# Patient Record
Sex: Female | Born: 2000 | Race: Asian | Hispanic: No | Marital: Single | Smoking: Never smoker
Health system: Southern US, Community
[De-identification: ages and names within clinical notes are randomized; demographics above are authoritative.]

---

## 2021-12-24 ENCOUNTER — Encounter (HOSPITAL_COMMUNITY): Payer: Self-pay

## 2021-12-24 ENCOUNTER — Emergency Department (HOSPITAL_COMMUNITY): Payer: Self-pay

## 2021-12-24 ENCOUNTER — Emergency Department (HOSPITAL_COMMUNITY)
Admission: EM | Admit: 2021-12-24 | Discharge: 2021-12-24 | Disposition: A | Discharge status: Home / Self Care | Payer: Self-pay | Attending: Emergency Medicine | Admitting: Emergency Medicine

## 2021-12-24 DIAGNOSIS — O26891 Other specified pregnancy related conditions, first trimester: Secondary | ICD-10-CM | POA: Insufficient documentation

## 2021-12-24 DIAGNOSIS — R42 Dizziness and giddiness: Secondary | ICD-10-CM | POA: Insufficient documentation

## 2021-12-24 DIAGNOSIS — R112 Nausea with vomiting, unspecified: Secondary | ICD-10-CM

## 2021-12-24 DIAGNOSIS — R1031 Right lower quadrant pain: Secondary | ICD-10-CM | POA: Insufficient documentation

## 2021-12-24 DIAGNOSIS — Z3491 Encounter for supervision of normal pregnancy, unspecified, first trimester: Secondary | ICD-10-CM

## 2021-12-24 DIAGNOSIS — O219 Vomiting of pregnancy, unspecified: Secondary | ICD-10-CM | POA: Insufficient documentation

## 2021-12-24 DIAGNOSIS — Z3A01 Less than 8 weeks gestation of pregnancy: Secondary | ICD-10-CM | POA: Insufficient documentation

## 2021-12-24 LAB — COMPREHENSIVE METABOLIC PANEL
ALT: 22 U/L (ref 0–44)
AST: 19 U/L (ref 15–41)
Albumin: 4.2 g/dL (ref 3.5–5.0)
Alkaline Phosphatase: 72 U/L (ref 38–126)
Anion gap: 6 (ref 5–15)
BUN: 10 mg/dL (ref 6–20)
CO2: 22 mmol/L (ref 22–32)
Calcium: 9.5 mg/dL (ref 8.9–10.3)
Chloride: 110 mmol/L (ref 98–111)
Creatinine, Ser: 0.43 mg/dL — ABNORMAL LOW (ref 0.44–1.00)
GFR, Estimated: >60 mL/min (ref >60)
Glucose, Bld: 97 mg/dL (ref 70–99)
Potassium: 3.7 mmol/L (ref 3.5–5.1)
Sodium: 138 mmol/L (ref 135–145)
Total Bilirubin: 0.6 mg/dL (ref 0.3–1.2)
Total Protein: 7.7 g/dL (ref 6.5–8.1)

## 2021-12-24 LAB — CBC
HCT: 38.4 % (ref 36.0–46.0)
Hemoglobin: 13.2 g/dL (ref 12.0–15.0)
MCH: 29.9 pg (ref 26.0–34.0)
MCHC: 34.4 g/dL (ref 30.0–36.0)
MCV: 87.1 fL (ref 80.0–100.0)
Platelets: 330 10*3/uL (ref 150–400)
RBC: 4.41 MIL/uL (ref 3.87–5.11)
RDW: 11.9 % (ref 11.5–15.5)
WBC: 7.6 10*3/uL (ref 4.0–10.5)
nRBC: 0 % (ref 0.0–0.2)

## 2021-12-24 LAB — ABO/RH: ABO/RH(D): B POS

## 2021-12-24 LAB — URINALYSIS, ROUTINE W REFLEX MICROSCOPIC
Bilirubin Urine: NEGATIVE
Glucose, UA: NEGATIVE mg/dL
Hgb urine dipstick: NEGATIVE
Ketones, ur: NEGATIVE mg/dL
Leukocytes,Ua: NEGATIVE
Nitrite: NEGATIVE
Protein, ur: NEGATIVE mg/dL
Specific Gravity, Urine: 1.019 (ref 1.005–1.030)
pH: 7 (ref 5.0–8.0)

## 2021-12-24 LAB — WET PREP, GENITAL
Clue Cells Wet Prep HPF POC: NONE SEEN
Sperm: NONE SEEN
Trich, Wet Prep: NONE SEEN
WBC, Wet Prep HPF POC: <10 (ref <10)
Yeast Wet Prep HPF POC: NONE SEEN

## 2021-12-24 LAB — LIPASE, BLOOD: Lipase: 49 U/L (ref 11–51)

## 2021-12-24 LAB — HCG, QUANTITATIVE, PREGNANCY: hCG, Beta Chain, Quant, S: 96060 m[IU]/mL — ABNORMAL HIGH (ref <5)

## 2021-12-24 LAB — I-STAT BETA HCG BLOOD, ED (MC, WL, AP ONLY): I-stat hCG, quantitative: >2000 m[IU]/mL — ABNORMAL HIGH (ref <5)

## 2021-12-24 IMAGING — MR MR ABDOMEN W/O CM
11 series · 47 of 48 positions shown · non-contrast
Comparison: None Available.

CLINICAL DATA: RIGHT lower quadrant pain. Concern for appendicitis.
Six weeks pregnant by ultrasound.

EXAM:
MRI ABDOMEN WITHOUT CONTRAST
TECHNIQUE: Multiplanar multisequence MR imaging was performed without the
administration of intravenous contrast.

[Series 3: T2 · axial · 5.0mm · 1.56mm/px · z∈[-177,+177]mm · 4 of 60 slices shown (1 of 2)]
[im 1/60]
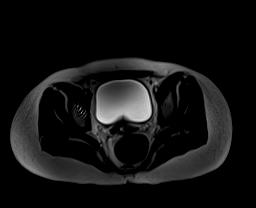
[im 20/60]
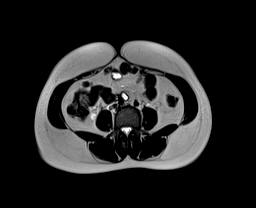
[im 40/60]
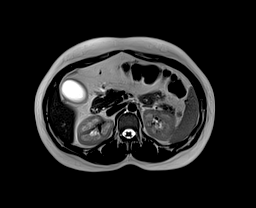
[im 60/60]
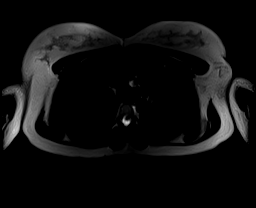

[Series 4: T2 fat-sat · axial · 5.0mm · 1.56mm/px · z∈[-177,+177]mm · 4 of 60 slices shown (1 of 2)]
[im 1/60]
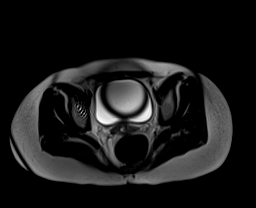
[im 20/60]
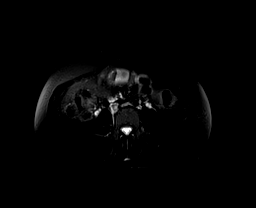
[im 40/60]
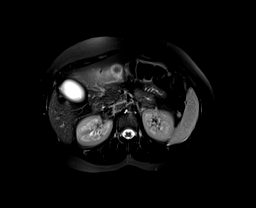
[im 60/60]
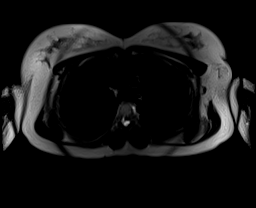

[Series 7: T2 · coronal · 6.0mm · 1.76mm/px · 2 of 32 slices shown (2 of 2)]
[im 1/32]
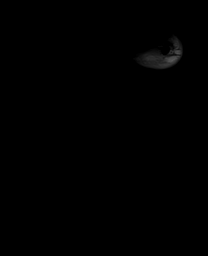
[im 32/32]
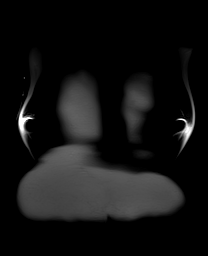

[Series 8: T2 fat-sat · coronal · 6.0mm · 1.76mm/px · 2 of 32 slices shown (2 of 2)]
[im 1/32]
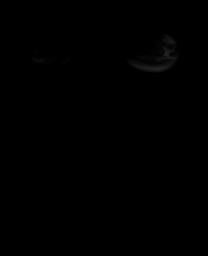
[im 32/32]
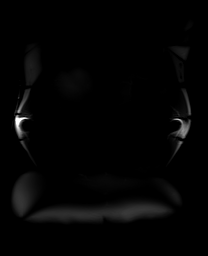

[Series 9: bSSFP · axial · 5.0mm · 0.70mm/px · z∈[-213,+153]mm · 4 of 62 slices shown (1 of 2)]
[im 1/62]
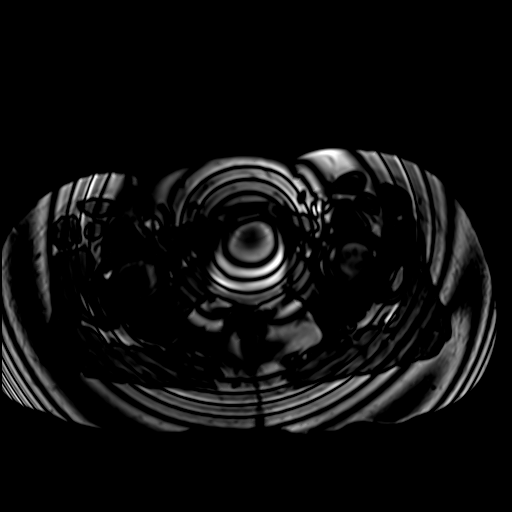
[im 21/62]
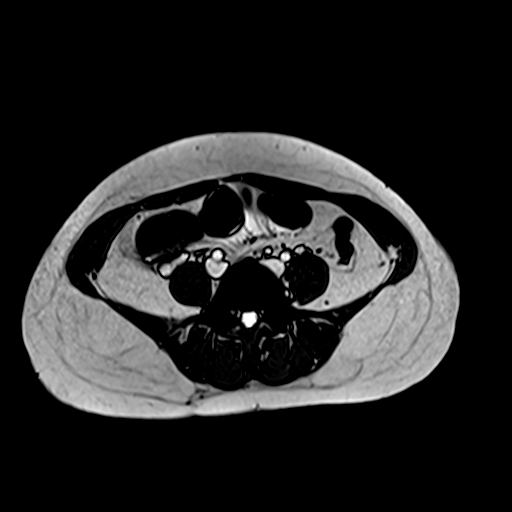
[im 41/62]
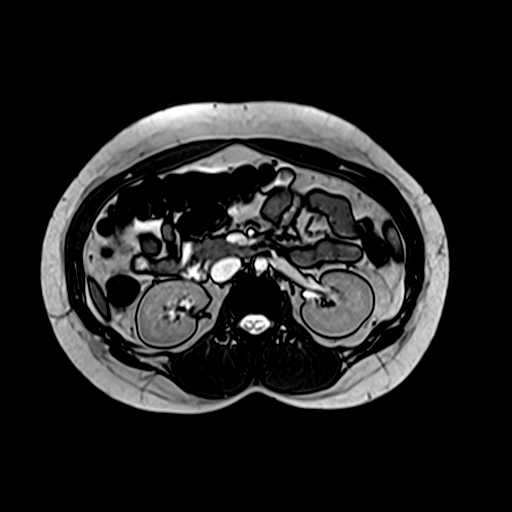
[im 62/62]
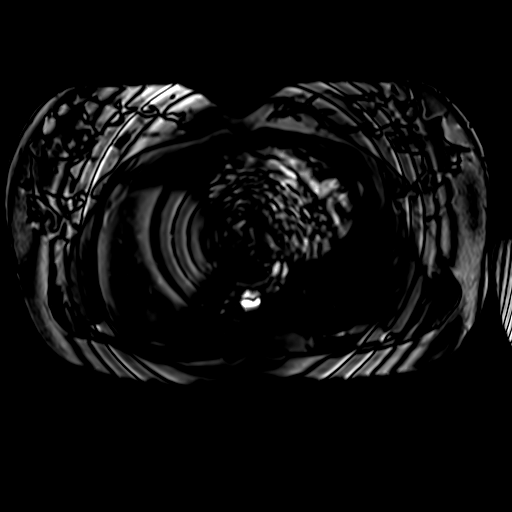

[Series 10: bSSFP · coronal · 6.0mm · 0.88mm/px · 2 of 32 slices shown (2 of 2)]
[im 1/32]
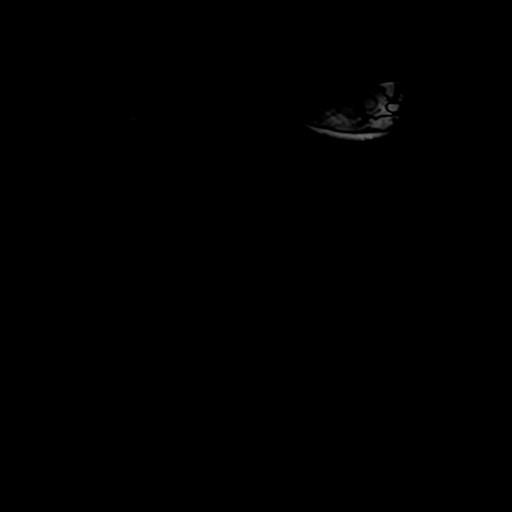
[im 32/32]
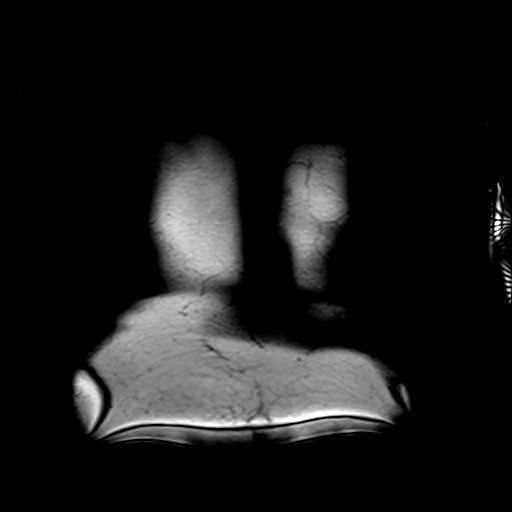

[Series 11: T1 dynamic · axial · 3.0mm · 1.25mm/px · z∈[-219,+138]mm · 7 of 120 slices shown (1 of 3)]
[im 1/120]
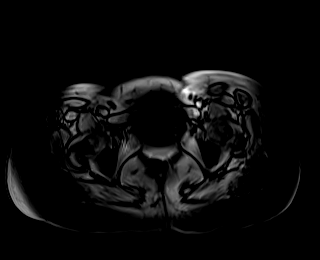
[im 20/120]
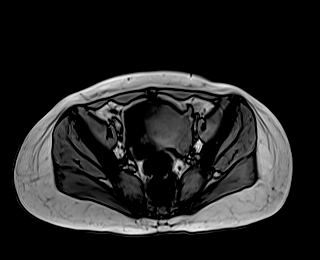
[im 40/120]
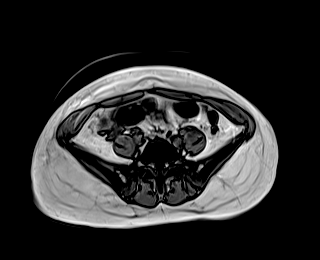
[im 60/120]
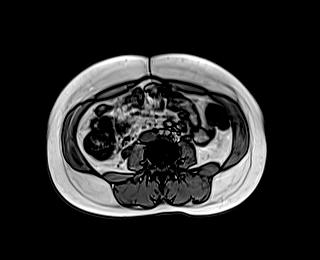
[im 80/120]
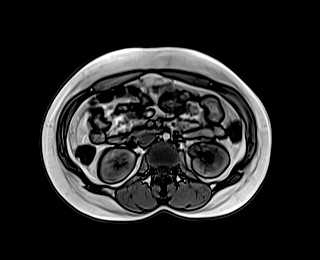
[im 100/120]
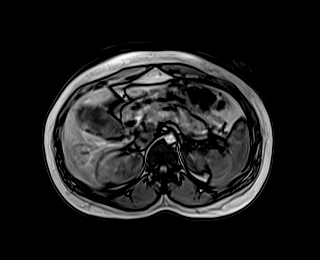
[im 120/120]
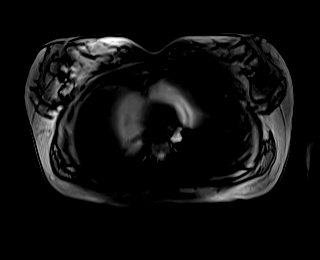

[Series 12: T1 dynamic · axial · 3.0mm · 1.25mm/px · z∈[-219,+138]mm · 7 of 120 slices shown (2 of 3)]
[im 1/120]
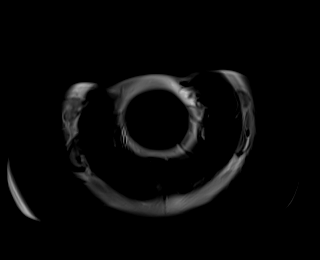
[im 20/120]
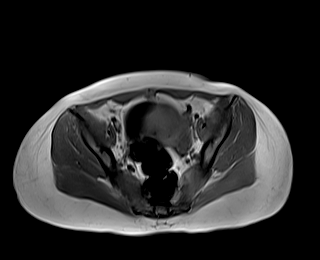
[im 40/120]
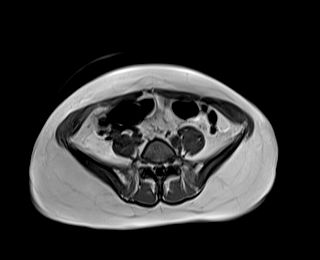
[im 60/120]
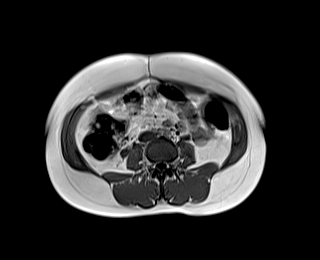
[im 80/120]
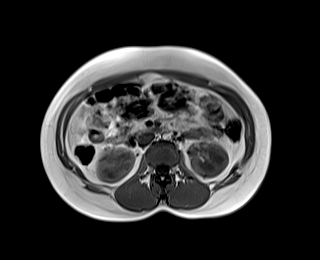
[im 100/120]
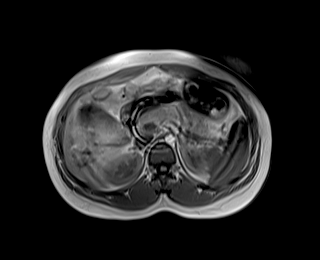
[im 120/120]
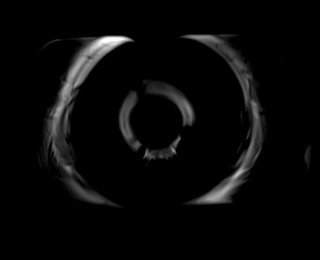

[Series 14: T1 dynamic · axial · 3.0mm · 1.25mm/px · z∈[-219,+138]mm · 7 of 120 slices shown (3 of 3)]
[im 1/120]
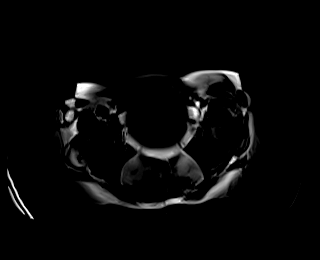
[im 20/120]
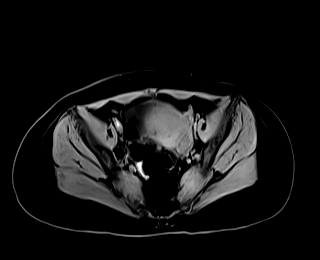
[im 40/120]
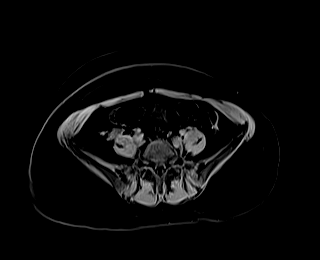
[im 60/120]
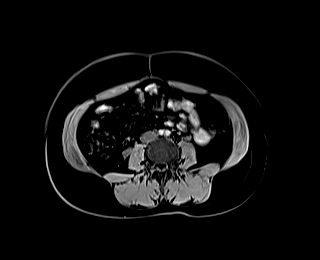
[im 80/120]
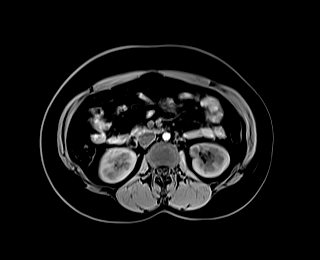
[im 100/120]
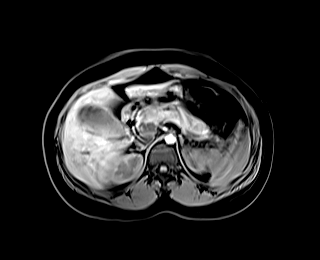
[im 120/120]
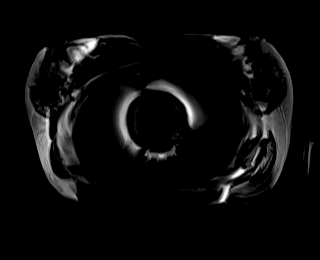

[Series 15: DWI · axial · 6.3mm · 1.49mm/px · z∈[-222,+141]mm · 6 of 98 slices shown (1 of 2)]
[im 1/98]
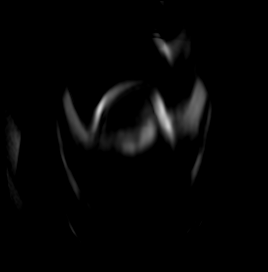
[im 20/98]
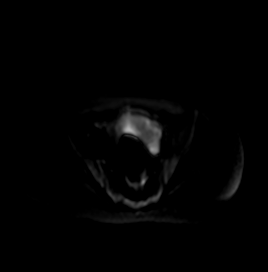
[im 39/98]
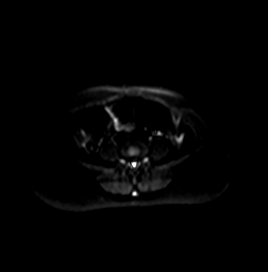
[im 59/98]
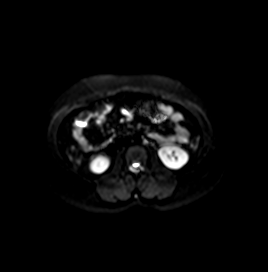
[im 78/98]
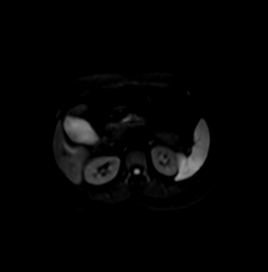
[im 98/98]
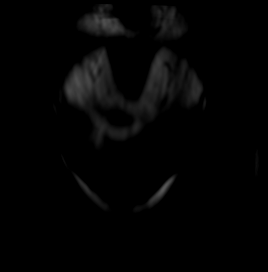

[Series 16: DWI · axial · 6.3mm · 1.49mm/px · z∈[-222,-40]mm · 2 of 49 slices shown (2 of 2)]
[im 1/49]
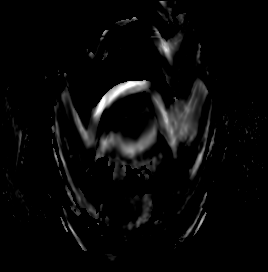
[im 25/49]
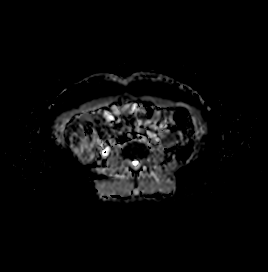

[47 of 48 positions shown; findings below may reference images not displayed]

FINDINGS: Lower chest: Lung bases are clear.

Hepatobiliary: No focal hepatic lesion. Gallbladder normal. Common
bile duct normal.

Pancreas: Pancreas is normal. No ductal dilatation. No pancreatic
inflammation.

Spleen: Normal spleen

Adrenals/urinary tract: Adrenal glands normal. No hydronephrosis.
Ureters normal. Bladder normal.

Stomach/Bowel: Stomach, small-bowel and cecum are normal. The
appendix is not identified but there is no pericecal inflammation to
suggest appendicitis. The colon and rectosigmoid colon are normal.

Vascular/Lymphatic: Abdominal aorta is normal caliber. No periportal
or retroperitoneal adenopathy. No pelvic adenopathy.

Reproductive: Gravid uterus with gestation sac noted. RIGHT ovary is
normal measuring 3.3 x 1.9 cm (53/series 3). LEFT ovary position
posterior to the uterus measuring 2.5 x 2.4 cm (image 57/3). Small
amount fluid in LEFT and RIGHT adnexa.

Other: No free fluid.

Musculoskeletal: No aggressive osseous lesion.
IMPRESSION: 1. Appendix not identified, however there are no secondary signs of
acute appendicitis.
2. No hydronephrosis.
3. Normal gallbladder.
4. Gravid uterus with normal ovaries.

## 2021-12-24 IMAGING — MR MR PELVIS W/O CM
11 series · 47 of 48 positions shown · non-contrast
Comparison: None Available.

CLINICAL DATA: RIGHT lower quadrant pain. Concern for appendicitis.
Six weeks pregnant by ultrasound.

EXAM:
MRI ABDOMEN WITHOUT CONTRAST
TECHNIQUE: Multiplanar multisequence MR imaging was performed without the
administration of intravenous contrast.

[Series 4: T2 · axial · 5.0mm · 1.56mm/px · z∈[-177,+177]mm · 4 of 60 slices shown (1 of 2)]
[im 1/60]
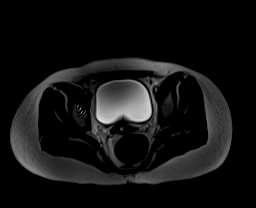
[im 20/60]
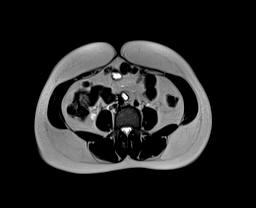
[im 40/60]
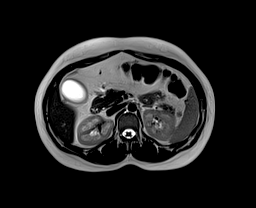
[im 60/60]
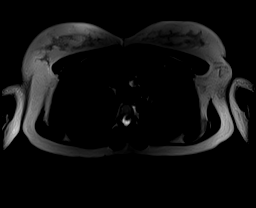

[Series 5: T2 fat-sat · axial · 5.0mm · 1.56mm/px · z∈[-177,+177]mm · 4 of 60 slices shown (1 of 2)]
[im 1/60]
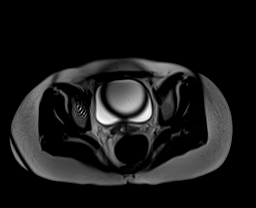
[im 20/60]
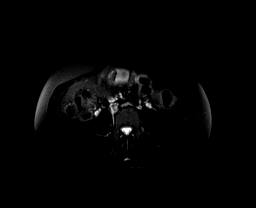
[im 40/60]
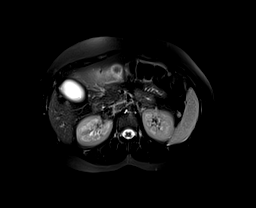
[im 60/60]
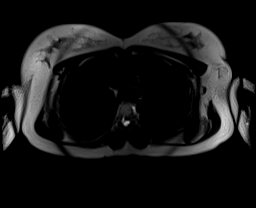

[Series 8: T2 · coronal · 6.0mm · 1.76mm/px · 2 of 32 slices shown (2 of 2)]
[im 1/32]
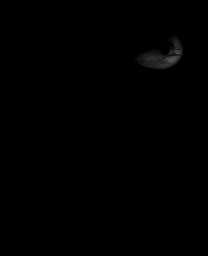
[im 32/32]
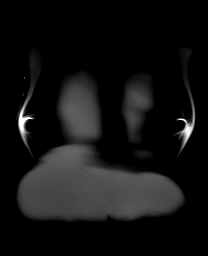

[Series 9: T2 fat-sat · coronal · 6.0mm · 1.76mm/px · 2 of 32 slices shown (2 of 2)]
[im 1/32]
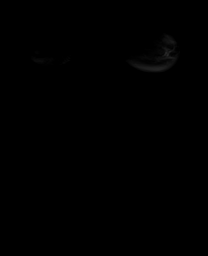
[im 32/32]
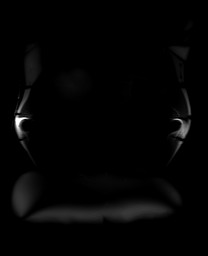

[Series 10: bSSFP · axial · 5.0mm · 0.70mm/px · z∈[-213,+153]mm · 4 of 62 slices shown (1 of 2)]
[im 1/62]
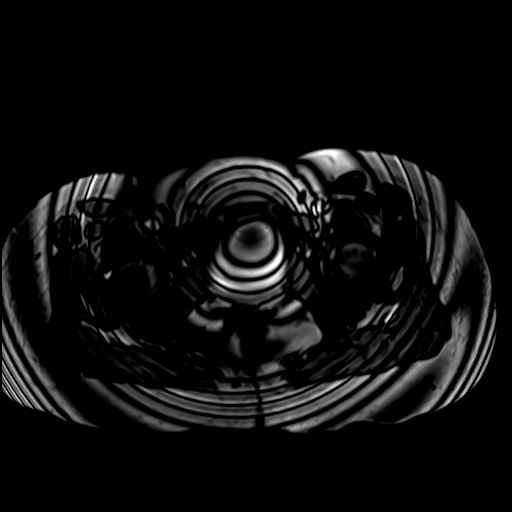
[im 21/62]
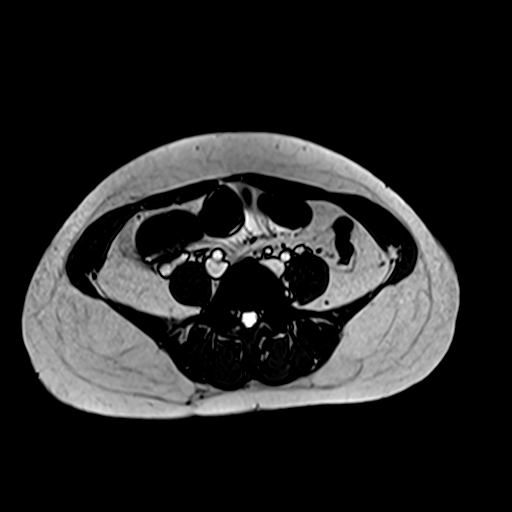
[im 41/62]
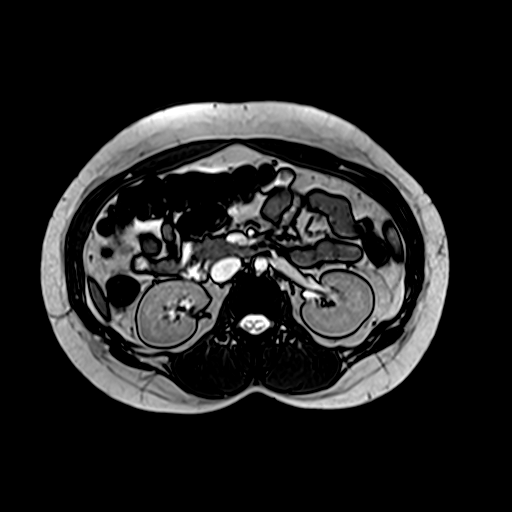
[im 62/62]
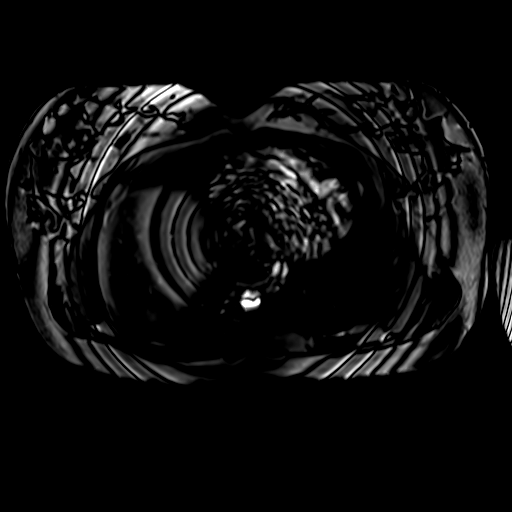

[Series 11: bSSFP · coronal · 6.0mm · 0.88mm/px · 2 of 32 slices shown (2 of 2)]
[im 1/32]
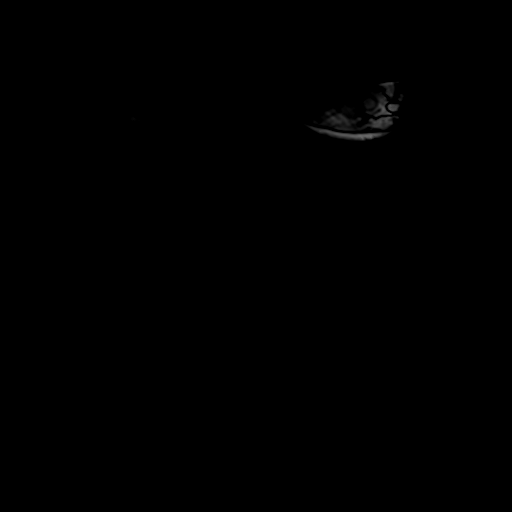
[im 32/32]
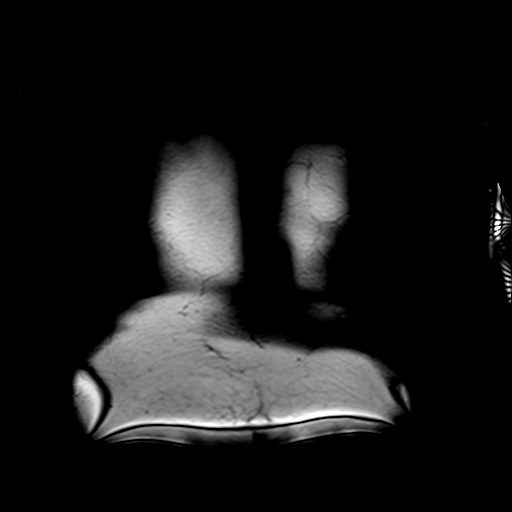

[Series 12: T1 dynamic · axial · 3.0mm · 1.25mm/px · z∈[-219,+138]mm · 7 of 120 slices shown (1 of 3)]
[im 1/120]
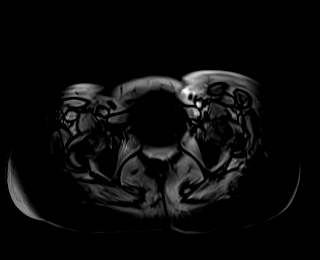
[im 20/120]
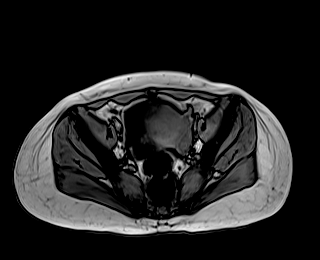
[im 40/120]
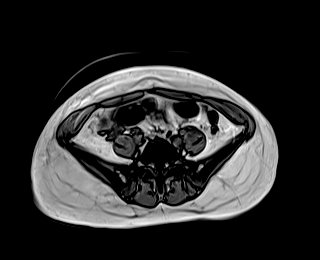
[im 60/120]
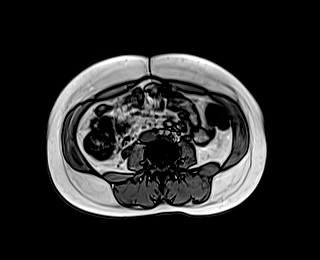
[im 80/120]
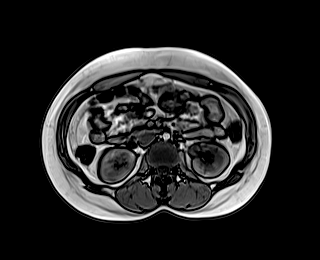
[im 100/120]
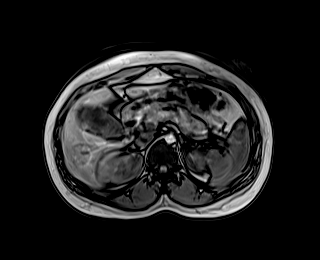
[im 120/120]
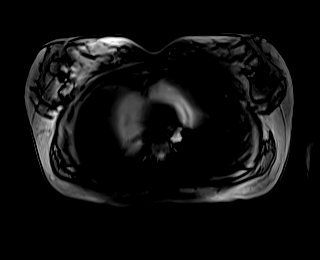

[Series 13: T1 dynamic · axial · 3.0mm · 1.25mm/px · z∈[-219,+138]mm · 7 of 120 slices shown (2 of 3)]
[im 1/120]
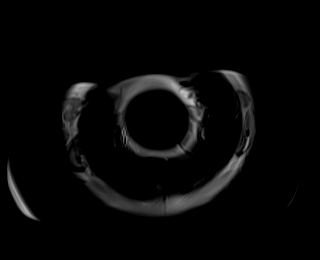
[im 20/120]
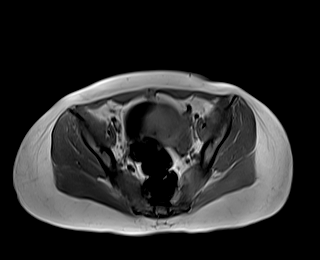
[im 40/120]
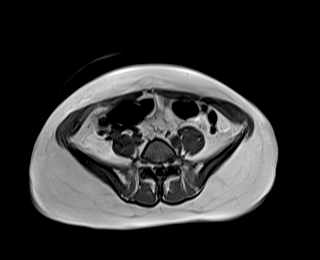
[im 60/120]
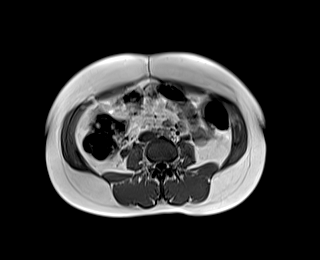
[im 80/120]
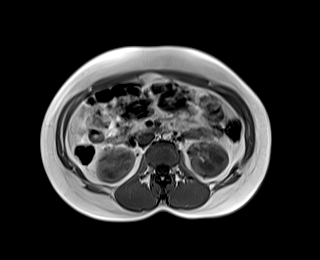
[im 100/120]
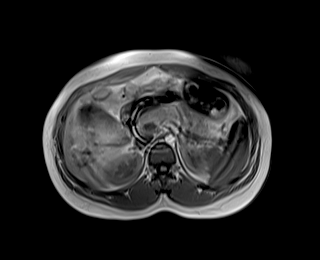
[im 120/120]
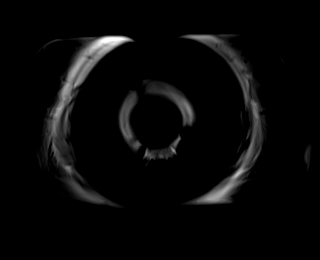

[Series 15: T1 dynamic · axial · 3.0mm · 1.25mm/px · z∈[-219,+138]mm · 7 of 120 slices shown (3 of 3)]
[im 1/120]
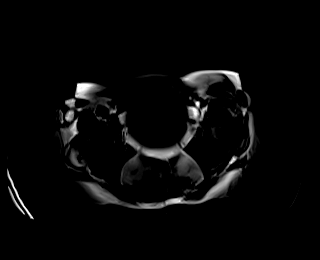
[im 20/120]
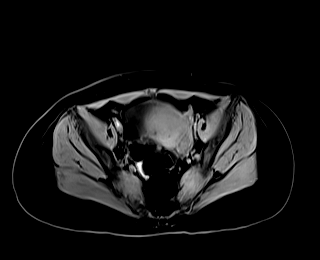
[im 40/120]
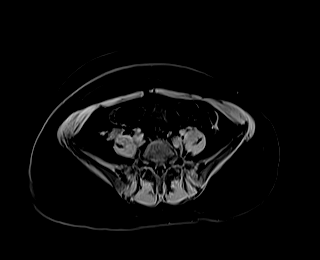
[im 60/120]
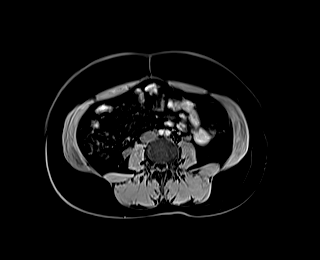
[im 80/120]
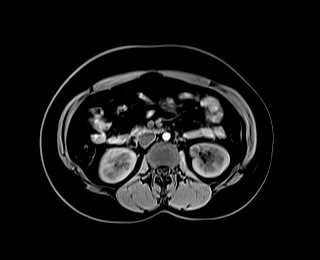
[im 100/120]
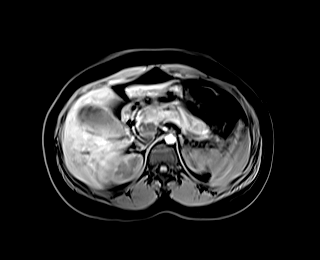
[im 120/120]
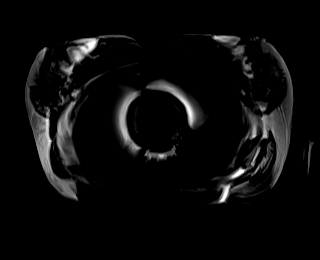

[Series 16: DWI · axial · 6.3mm · 1.49mm/px · z∈[-222,+141]mm · 6 of 98 slices shown (1 of 2)]
[im 1/98]
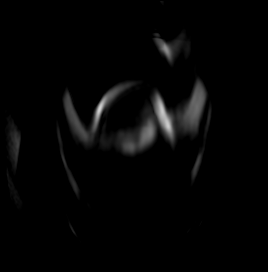
[im 20/98]
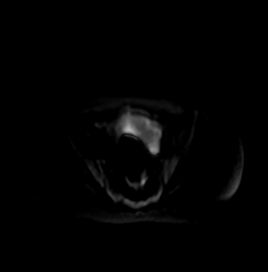
[im 39/98]
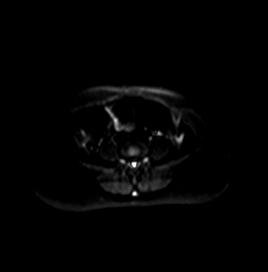
[im 59/98]
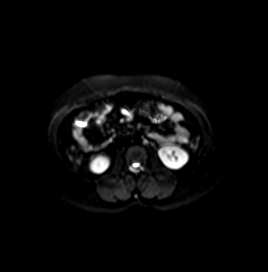
[im 78/98]
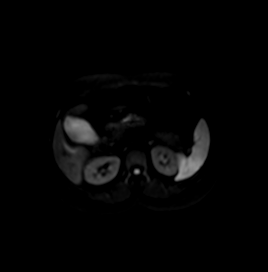
[im 98/98]
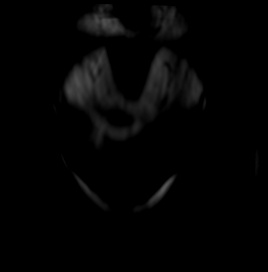

[Series 17: DWI · axial · 6.3mm · 1.49mm/px · z∈[-222,-40]mm · 2 of 49 slices shown (2 of 2)]
[im 1/49]
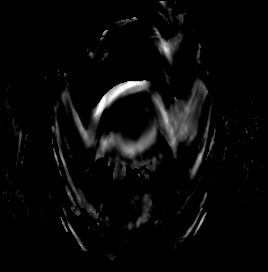
[im 25/49]
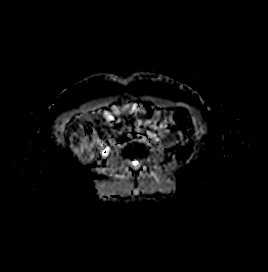

[47 of 48 positions shown; findings below may reference images not displayed]

FINDINGS: Lower chest: Lung bases are clear.

Hepatobiliary: No focal hepatic lesion. Gallbladder normal. Common
bile duct normal.

Pancreas: Pancreas is normal. No ductal dilatation. No pancreatic
inflammation.

Spleen: Normal spleen

Adrenals/urinary tract: Adrenal glands normal. No hydronephrosis.
Ureters normal. Bladder normal.

Stomach/Bowel: Stomach, small-bowel and cecum are normal. The
appendix is not identified but there is no pericecal inflammation to
suggest appendicitis. The colon and rectosigmoid colon are normal.

Vascular/Lymphatic: Abdominal aorta is normal caliber. No periportal
or retroperitoneal adenopathy. No pelvic adenopathy.

Reproductive: Gravid uterus with gestation sac noted. RIGHT ovary is
normal measuring 3.3 x 1.9 cm (53/series 3). LEFT ovary position
posterior to the uterus measuring 2.5 x 2.4 cm (image 57/3). Small
amount fluid in LEFT and RIGHT adnexa.

Other: No free fluid.

Musculoskeletal: No aggressive osseous lesion.
IMPRESSION: 1. Appendix not identified, however there are no secondary signs of
acute appendicitis.
2. No hydronephrosis.
3. Normal gallbladder.
4. Gravid uterus with normal ovaries.

## 2021-12-24 IMAGING — US US OB COMP LESS 14 WK
1 series · 14 of 28 positions shown · non-contrast
Comparison: None Available.

CLINICAL DATA: Abdominal pain, nausea, vomiting

EXAM:
OBSTETRIC <14 WK ULTRASOUND
TECHNIQUE: Transabdominal ultrasound was performed for evaluation of the
gestation as well as the maternal uterus and adnexal regions.

[Series 1: us ob comp less 14 wks · 14 of 68 slices shown]
[im 3/68]
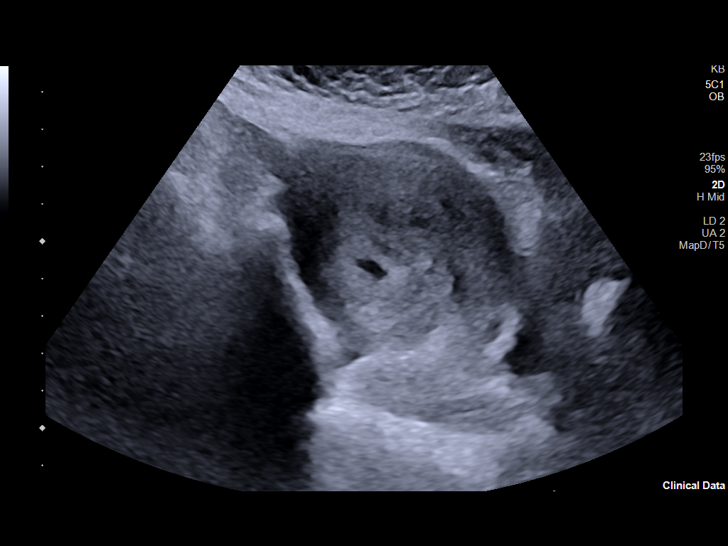
[im 8/68]
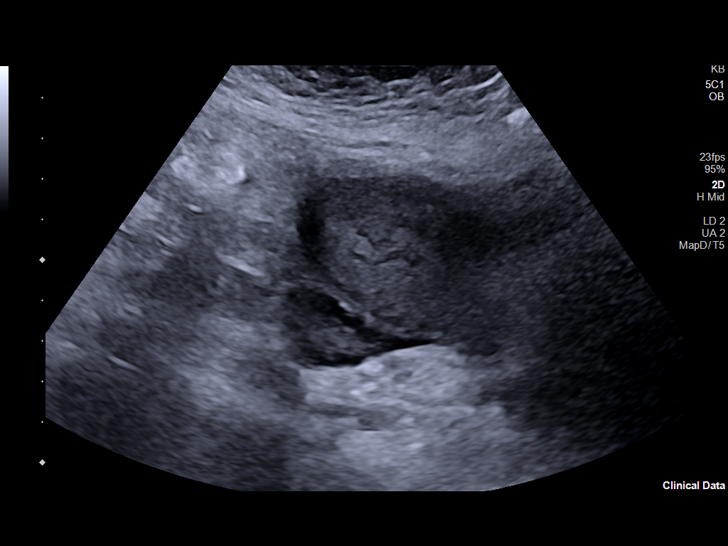
[im 13/68]
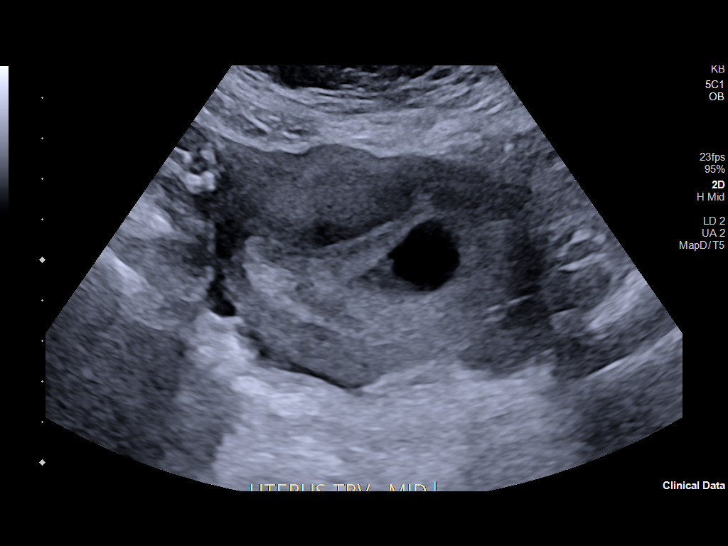
[im 18/68]
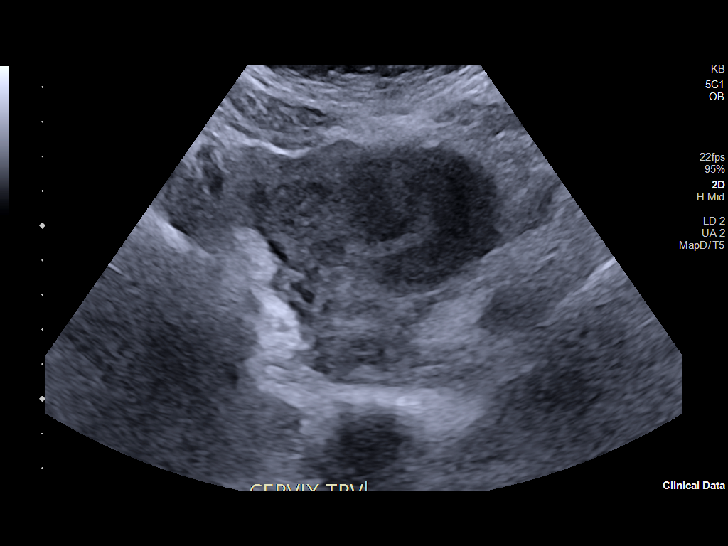
[im 23/68]
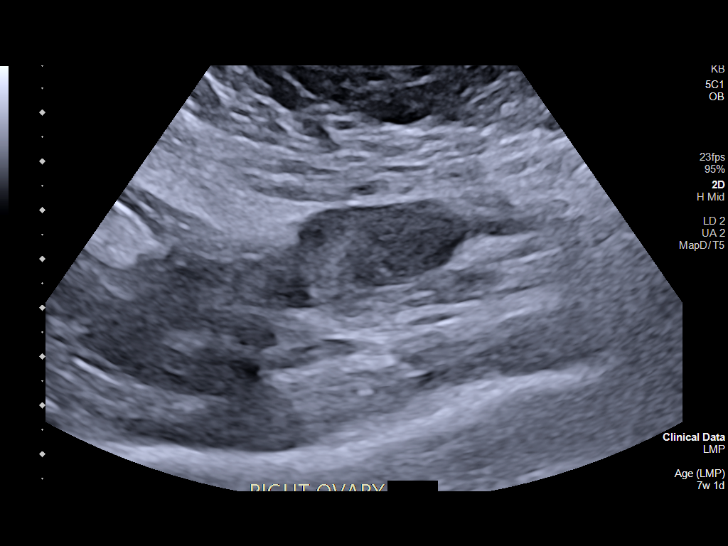
[im 28/68]
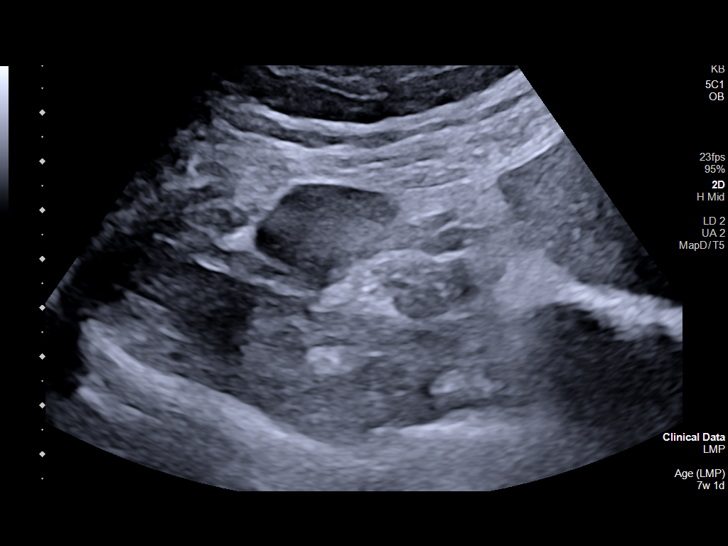
[im 33/68]
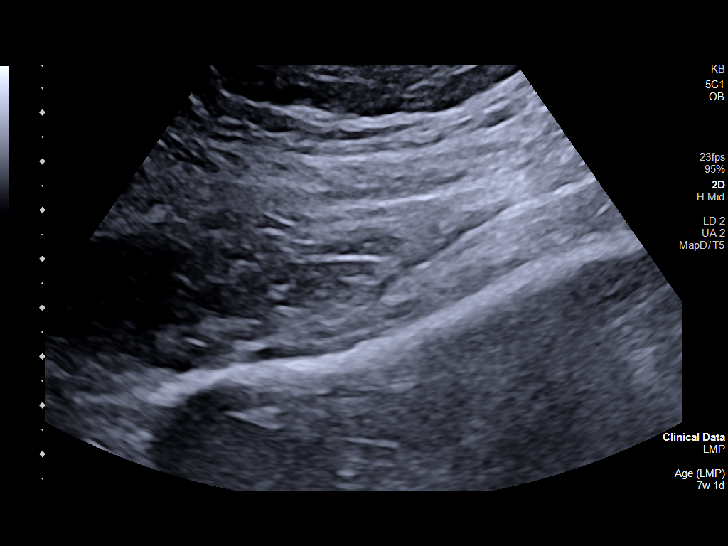
[im 38/68]
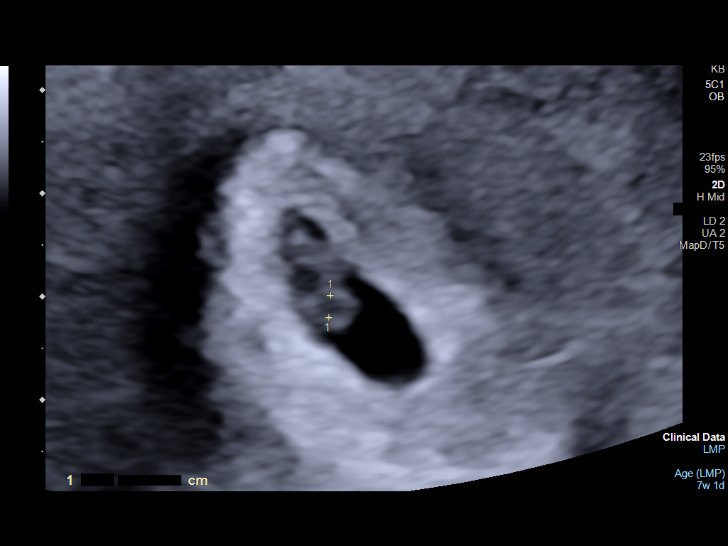
[im 43/68]
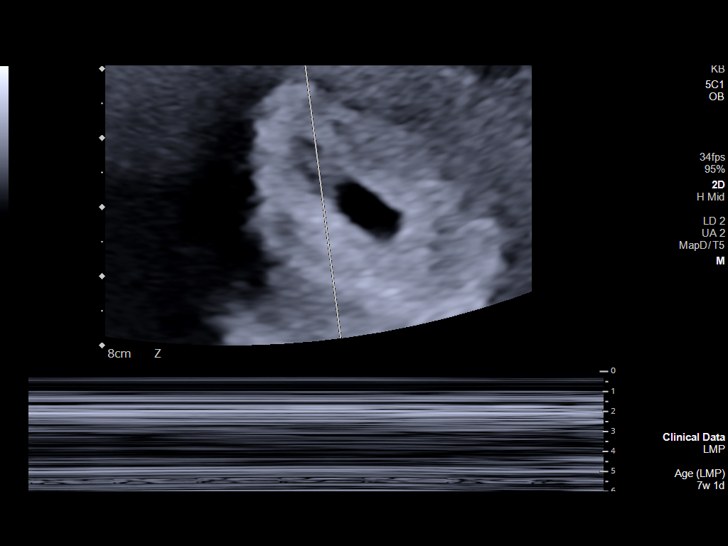
[im 48/68]
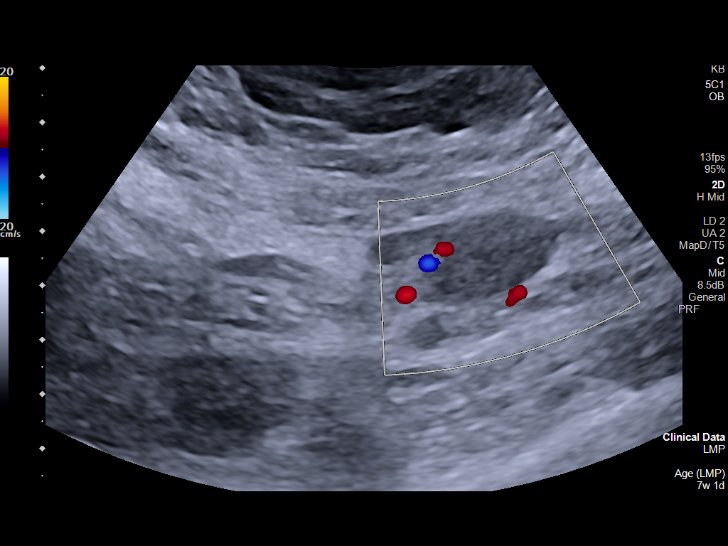
[im 53/68]
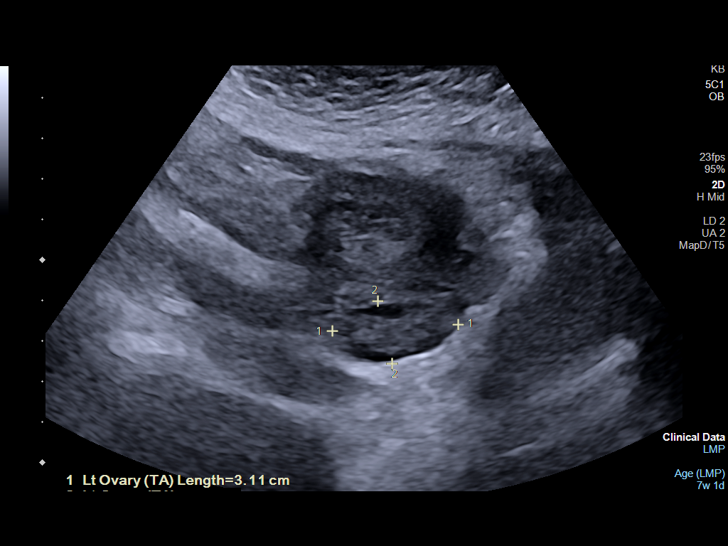
[im 58/68]
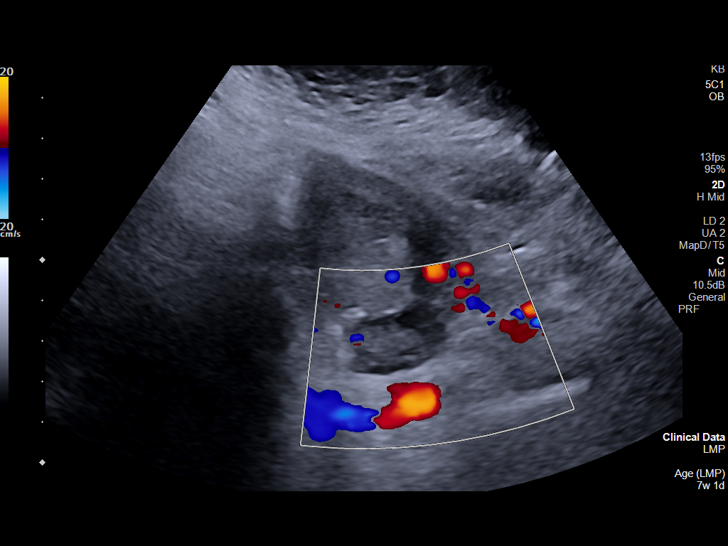
[im 63/68]
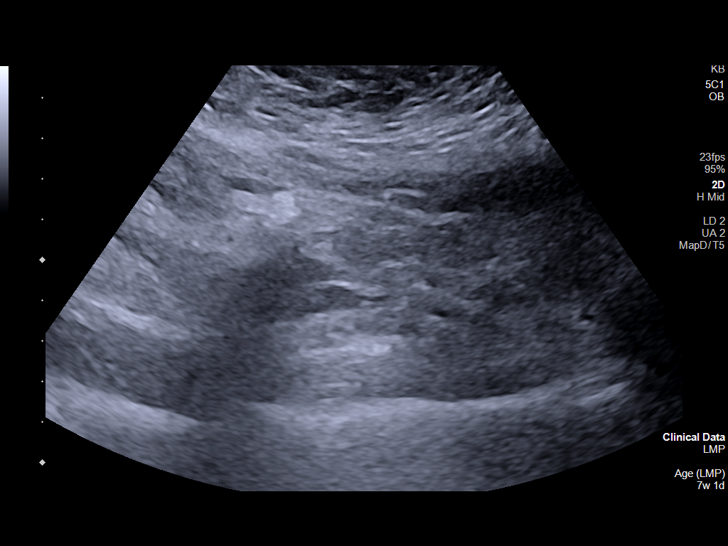
[im 68/68]
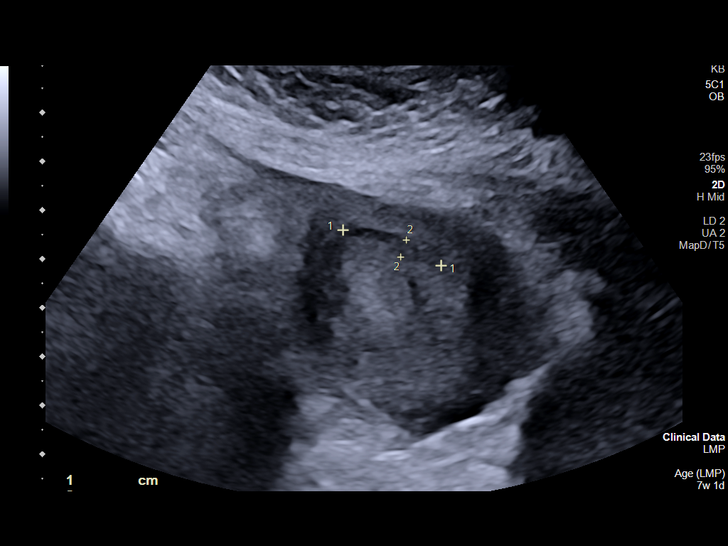

[14 of 28 positions shown; findings below may reference images not displayed]

FINDINGS: Intrauterine gestational sac: Single

Yolk sac:  Seen

Embryo:  Seen

Cardiac Activity:

Heart Rate: 123 bpm

CRL:   7.4 mm   6 w 4 d                  US EDC: [DATE]

Subchorionic hemorrhage: There is small linear hypoechoic area
measuring 4 mm in thickness adjacent to the gestational sac
suggesting small subchorionic bleed.

Maternal uterus/adnexae: Unremarkable. There is no free fluid in the
pelvis.
IMPRESSION: Single live intrauterine pregnancy seen. Sonographically estimated
gestational age is 6 weeks 4 days.

Small linear hypoechoic structure measuring 4 mm in thickness
adjacent to the gestational sac suggesting small subchorionic bleed.

## 2021-12-24 MED ORDER — ONDANSETRON HCL 4 MG/2ML IJ SOLN
4.0000 mg | Freq: Once | INTRAMUSCULAR | Status: AC
  Administered 2021-12-24: 4 mg via INTRAVENOUS
  Filled 2021-12-24: qty 2

## 2021-12-24 MED ORDER — MORPHINE SULFATE (PF) 2 MG/ML IV SOLN
2.0000 mg | Freq: Once | INTRAVENOUS | Status: AC
  Administered 2021-12-24: 2 mg via INTRAVENOUS
  Filled 2021-12-24: qty 1

## 2021-12-24 MED ORDER — ONDANSETRON HCL 4 MG PO TABS: 4.0000 mg | ORAL_TABLET | Freq: Four times a day (QID) | ORAL | 0 refills | Status: AC

## 2021-12-24 MED ORDER — SODIUM CHLORIDE 0.9 % IV BOLUS
1000.0000 mL | Freq: Once | INTRAVENOUS | Status: AC
  Administered 2021-12-24: 1000 mL via INTRAVENOUS

## 2021-12-24 MED ORDER — MORPHINE SULFATE (PF) 4 MG/ML IV SOLN
4.0000 mg | Freq: Once | INTRAVENOUS | Status: AC
  Administered 2021-12-24: 4 mg via INTRAVENOUS
  Filled 2021-12-24: qty 1

## 2021-12-24 NOTE — ED Triage Notes (Signed)
Pt. Has communication barrier but C/o abdominal pain @ a 10/10.

## 2021-12-24 NOTE — ED Notes (Signed)
Brought to FT room for [place].

## 2021-12-24 NOTE — ED Provider Notes (Signed)
  Physical Exam  BP (!) 102/47   Pulse 60   Temp 98.3 F (36.8 C) (Oral)   Resp 18   Ht 5\' 5"  (1.651 m)   Wt 63 kg   LMP  (LMP Unknown)   SpO2 99%   BMI 23.11 kg/m   Physical Exam  Procedures  Procedures  ED Course / MDM   Clinical Course as of 12/24/21 1532  Wed Dec 24, 2021  [id number] 21 year old female here with abdominal pain.  Last menstrual period in April.  I performed bedside ultrasound that did not show any obvious free fluid.  Probable IUP.  Awaiting formal ultrasound and labs. [MB]    Clinical Course User Index [MB] May, MD   Medical Decision Making Amount and/or Complexity of Data Reviewed Labs: ordered. Radiology: ordered.  Risk Prescription drug management.  See prior note from Terrilee Files, PA-C for full note.  Patient had 3-4 day history of nausea/dizziness with diffuse abdominal pain.  She was unaware of the viability of her pregnancy given that 2 weeks ago and ultrasound was done with an undetectable heartbeat a fetus in [place].  On exam, she had right lower quadrant tenderness.  Given her pregnancy status, MRI was obtained to evaluate for possible appendicitis.  MRI was negative for any secondary appendicitis changes although did not visualize the appendix.  No hydronephrosis or cholecystitis noted as well.  Patient not staying in the area for very long and is going back to her home in [place].  Close OB/GYN follow-up recommended when she is back home in a couple days.  Zofran prescribed to take as needed for nausea/vomiting.  Worrisome signs and symptoms were discussed with the patient and the patient knowledge understanding to return to the emergency department if noticed.  Patient was stable upon discharge.        [place], Peter Garter 12/24/21 1703    12/26/21, MD 12/25/21 0020

## 2021-12-24 NOTE — ED Notes (Signed)
Patient transported to MRI 

## 2021-12-24 NOTE — ED Provider Triage Note (Signed)
Emergency Medicine Provider Triage Evaluation Note  Chelsea Brewer , a 21 y.o. female  was evaluated in triage.  Pt complains of abdominal pain x 4 days with n/v.  Review of Systems  Positive: Abdominal pain, n/v  Negative: Dysuria, changes bowel habits  Physical Exam  BP 94/65 (BP Location: Left Arm)   Pulse 84   Temp 98.3 F (36.8 C) (Oral)   Resp 20   Ht 5\' 5"  (1.651 m)   Wt 63 kg   LMP  (LMP Unknown)   SpO2 97%   BMI 23.11 kg/m  Gen:   Awake, no distress   Resp:  Normal effort  MSK:   Moves extremities without difficulty  Other:  RLQ TTP  Medical Decision Making  Medically screening exam initiated at 10:45 AM.  Appropriate orders placed.  Chelsea Brewer was informed that the remainder of the evaluation will be completed by another provider, this initial triage assessment does not replace that evaluation, and the importance of remaining in the ED until their evaluation is complete.     , PA-C 12/24/21 1045

## 2021-12-24 NOTE — Discharge Instructions (Addendum)
Overall reassured that there is no indication for inflammation of your appendix.  I will prescribe Zofran (antinausea medicine you to while in emergency department) to take as needed.  Since you are going back to [place] soon, I will not provide an OB/GYN in the area.  Please have close follow-up with your OB/GYN in [place].  Avoid NSAIDs such as Aleve or ibuprofen for your symptoms as that can have negative effects on your baby.  She can take Tylenol as needed.  Please do not hesitate to return to the emergency department if the worrisome signs and symptoms we discussed become apparent.

## 2021-12-24 NOTE — ED Provider Notes (Signed)
[facility]-EMERGENCY DEPT Provider Note   CSN: [id number] Arrival date & time: 12/24/21  1443     History  Chief Complaint  Patient presents with   Abdominal Pain    Chelsea Brewer is a 21 y.o. female.  21 year old female presents with complaint of nausea and dizziness for the past few days, also abdominal pain- diffuse. Abdominal pain is worse with walking or lifting, described as heavy. No similar pain previously.  Denies changes in bowel or bladder habits, vaginal bleeding or discharge.   Patient has had 2 c-sections. G4P2 (1 miscarriage).  LMP 10 days ago.  Went to an ER in [place] 2 weeks ago for vaginal bleeding, unable to hear heart beat and was unable to say if she was pregnant or not. Lives in [place], [place].   A language interpreter was used [place]).  Abdominal Pain      Home Medications Prior to Admission medications   Not on File      Allergies    Patient has no known allergies.    Review of Systems   Review of Systems  Gastrointestinal:  Positive for abdominal pain.   Negative except as per HPI Physical Exam Updated Vital Signs BP (!) 91/54 (BP Location: Right Arm)   Pulse (!) 55   Temp 98.3 F (36.8 C) (Oral)   Resp 17   Ht 5\' 5"  (1.651 m)   Wt 63 kg   LMP  (LMP Unknown)   SpO2 99%   BMI 23.11 kg/m  Physical Exam Vitals and nursing note reviewed. Exam conducted with a chaperone present.  Constitutional:      General: She is not in acute distress.    Appearance: She is well-developed. She is not diaphoretic.  HENT:     Head: Normocephalic and atraumatic.  Cardiovascular:     Rate and Rhythm: Normal rate and regular rhythm.     Heart sounds: Normal heart sounds.  Pulmonary:     Effort: Pulmonary effort is normal.  Abdominal:     Palpations: Abdomen is soft.     Tenderness: There is abdominal tenderness in the right lower quadrant and left lower quadrant. There is no right CVA tenderness or left CVA tenderness.   Genitourinary:    Vagina: Normal.     Cervix: No cervical motion tenderness or discharge.     Adnexa:        Right: Tenderness present.        Left: No tenderness.    Skin:    General: Skin is warm and dry.     Findings: No erythema or rash.  Neurological:     Mental Status: She is alert and oriented to person, place, and time.  Psychiatric:        Behavior: Behavior normal.     ED Results / Procedures / Treatments   Labs (all labs ordered are listed, but only abnormal results are displayed) Labs Reviewed  COMPREHENSIVE METABOLIC PANEL - Abnormal; Notable for the following components:      Result Value   Creatinine, Ser 0.43 (*)    All other components within normal limits  URINALYSIS, ROUTINE W REFLEX MICROSCOPIC - Abnormal; Notable for the following components:   APPearance HAZY (*)    All other components within normal limits  HCG, QUANTITATIVE, PREGNANCY - Abnormal; Notable for the following components:   hCG, Beta Chain, Quant, S 96,060 (*)    All other components within normal limits  I-STAT BETA HCG BLOOD, ED (  [facility], [facility], [facility] ONLY) - Abnormal; Notable for the following components:   I-stat hCG, quantitative >2,000.0 (*)    All other components within normal limits  WET PREP, GENITAL  LIPASE, BLOOD  CBC  ABO/RH  GC/CHLAMYDIA PROBE AMP ([place]) NOT AT [facility]    EKG None  Radiology US OB Comp < 14 Wks  Result Date: 12/24/2021 CLINICAL DATA:  Abdominal pain, nausea, vomiting EXAM: OBSTETRIC <14 WK ULTRASOUND TECHNIQUE: Transabdominal ultrasound was performed for evaluation of the gestation as well as the maternal uterus and adnexal regions. COMPARISON:  None Available. FINDINGS: Intrauterine gestational sac: Single Yolk sac:  Seen Embryo:  Seen Cardiac Activity: Heart Rate: 123 bpm CRL:   7.4 mm   6 w 4 d                  US EDC: 08/15/2022 Subchorionic hemorrhage: There is small linear hypoechoic area measuring 4 mm in thickness adjacent to the gestational sac  suggesting small subchorionic bleed. Maternal uterus/adnexae: Unremarkable. There is no free fluid in the pelvis. IMPRESSION: Single live intrauterine pregnancy seen. Sonographically estimated gestational age is 6 weeks 4 days. Small linear hypoechoic structure measuring 4 mm in thickness adjacent to the gestational sac suggesting small subchorionic bleed. Electronically Signed   By: Ernie AvenaPalani  Rathinasamy M.D.   On: 12/24/2021 13:05    Procedures Procedures    Medications Ordered in ED Medications  sodium chloride 0.9 % bolus 1,000 mL (has no administration in time range)  ondansetron (ZOFRAN) injection 4 mg (has no administration in time range)  morphine (PF) 2 MG/ML injection 2 mg (has no administration in time range)  ondansetron (ZOFRAN) injection 4 mg (4 mg Intravenous Given 12/24/21 1254)  morphine (PF) 4 MG/ML injection 4 mg (4 mg Intravenous Given 12/24/21 1252)  sodium chloride 0.9 % bolus 1,000 mL (0 mLs Intravenous Stopped 12/24/21 1425)    ED Course/ Medical Decision Making/ A&P Clinical Course as of 12/24/21 1503  Wed Dec 24, 2021  [id number] 21 year old female here with abdominal pain.  Last menstrual period in April.  I performed bedside ultrasound that did not show any obvious free fluid.  Probable IUP.  Awaiting formal ultrasound and labs. [MB]    Clinical Course User Index [MB] Terrilee FilesButler, Michael C, MD                           Medical Decision Making Amount and/or Complexity of Data Reviewed Labs: ordered. Radiology: ordered.  Risk Prescription drug management.   This patient presents to the ED for concern of abdominal pain, this involves an extensive number of treatment options, and is a complaint that carries with it a high risk of complications and morbidity.  The differential diagnosis includes ectopic pregnancy, appendicitis, cyst, torsion   Co morbidities that complicate the patient evaluation  Pregnant   Additional history obtained:  External records from  outside source obtained and reviewed including no prior relevant records on file for review   Lab Tests:  I Ordered, and personally interpreted labs.  The pertinent results include: CBC with normal white count, lipase normal.  CMP without significant findings.  hCG elevated at 96,060.  Urinalysis is hazy.  ABO Rh is B+.   Imaging Studies ordered:  I ordered imaging studies including OB ultrasound revealing IUP at 6 weeks 4 days with positive cardiac activity, question small subchorionic hemorrhage I agree with the radiologist interpretation   Problem List / ED Course /  Critical interventions / Medication management  21 year old female presents with complaint of right lower quadrant and generalized abdominal pain as above.  Pain is worse with movement and walking.  On exam has tenderness palpation generalized abdomen, more so in the right lower quadrant.  Pelvic exam is unremarkable with exception of significant right lower quadrant tenderness.  OB ultrasound shows a viable IUP at 6 weeks 4 days.  Labs reassuring.  Plan is to proceed with MRI to evaluate for appendicitis.  Care signed out at change of shift pending MRI. I ordered medication including IV fluids, morphine, Zofran for pain Reevaluation of the patient after these medicines showed that the patient stayed the same I have reviewed the patients home medicines and have made adjustments as needed   Social Determinants of Health:  Does not have local OB, is visiting from out of state, primarily speaks [place].   Test / Admission - Considered:  MRI abdomen pelvis for appendicitis         Final Clinical Impression(s) / ED Diagnoses Final diagnoses:  Right lower quadrant abdominal pain  First trimester pregnancy  Nausea and vomiting, unspecified vomiting type    Rx / DC Orders ED Discharge Orders     None         Jeannie Fend, PA-C 12/24/21 1503    Terrilee Files, MD 12/24/21 1646

## 2021-12-24 NOTE — ED Notes (Signed)
An After Visit Summary was printed and given to the patient. Discharge instructions given and no further questions at this time.  

## 2021-12-25 LAB — GC/CHLAMYDIA PROBE AMP (~~LOC~~) NOT AT ARMC
Chlamydia: NEGATIVE
Comment: NEGATIVE
Comment: NORMAL
Neisseria Gonorrhea: NEGATIVE
